# Patient Record
Sex: Male | Born: 2000 | Race: White | Hispanic: No | Marital: Single | Smoking: Never smoker
Health system: Southern US, Community
[De-identification: ages and names within clinical notes are randomized; demographics above are authoritative.]

## PROBLEM LIST (undated history)

## (undated) DIAGNOSIS — J45909 Unspecified asthma, uncomplicated: Secondary | ICD-10-CM

## (undated) HISTORY — PX: MYRINGOTOMY WITH TUBE PLACEMENT: SHX5663

## (undated) HISTORY — PX: TONSILLECTOMY: SUR1361

## (undated) HISTORY — PX: ADENOIDECTOMY: SUR15

---
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
## 2021-05-15 DIAGNOSIS — J4530 Mild persistent asthma, uncomplicated: Secondary | ICD-10-CM | POA: Diagnosis not present

## 2021-10-20 IMAGING — XA DG TIBIA/FIBULA 2V*R*
3 series · 4 of 4 positions shown · non-contrast
Comparison: None

CLINICAL DATA: 18-year-old male status post distal fibular ORIF.

EXAM:
RIGHT TIBIA AND FIBULA - 2 VIEW; DG C-ARM 1-60 MIN

[Series 1: ortho standard · 1 of 1 slices shown (1 of 3)]
[im 1/1]
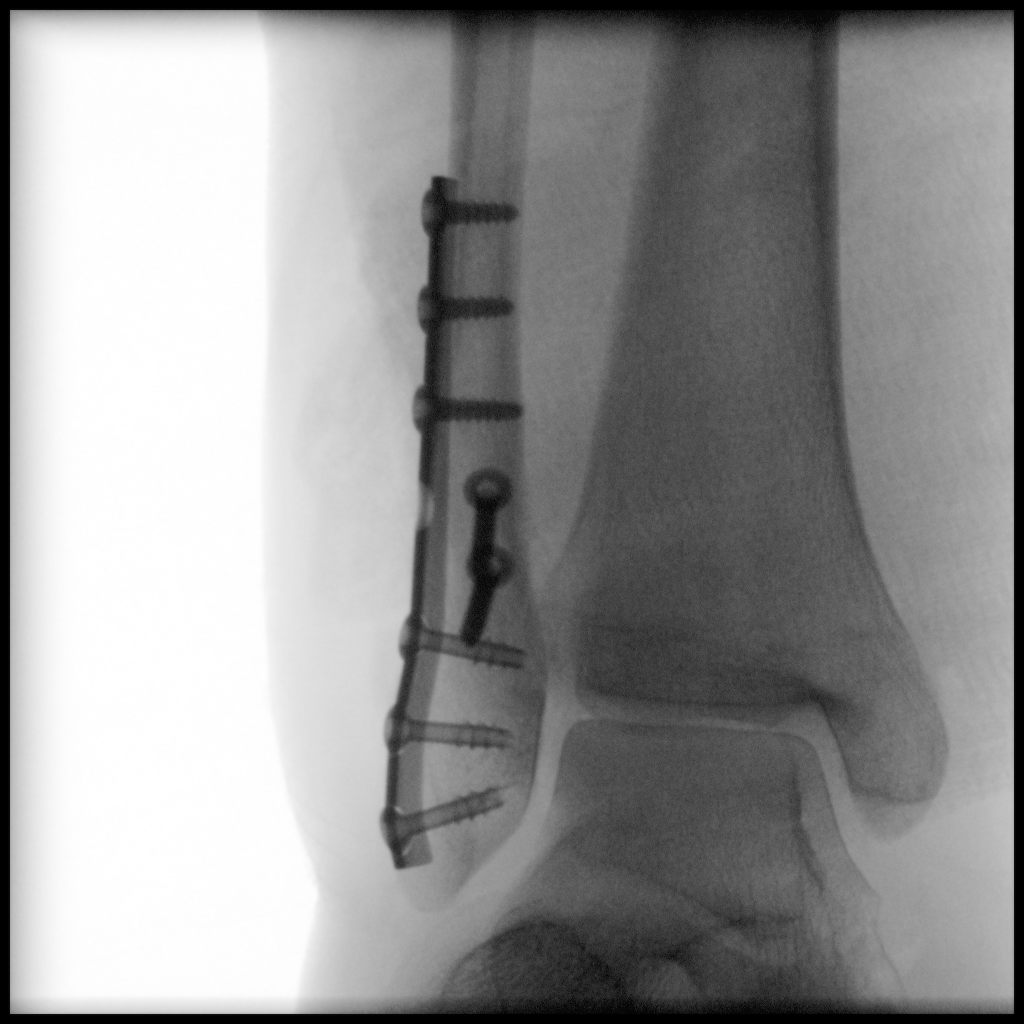

[Series 2: ortho standard · 1 of 1 slices shown (2 of 3)]
[im 1/1]
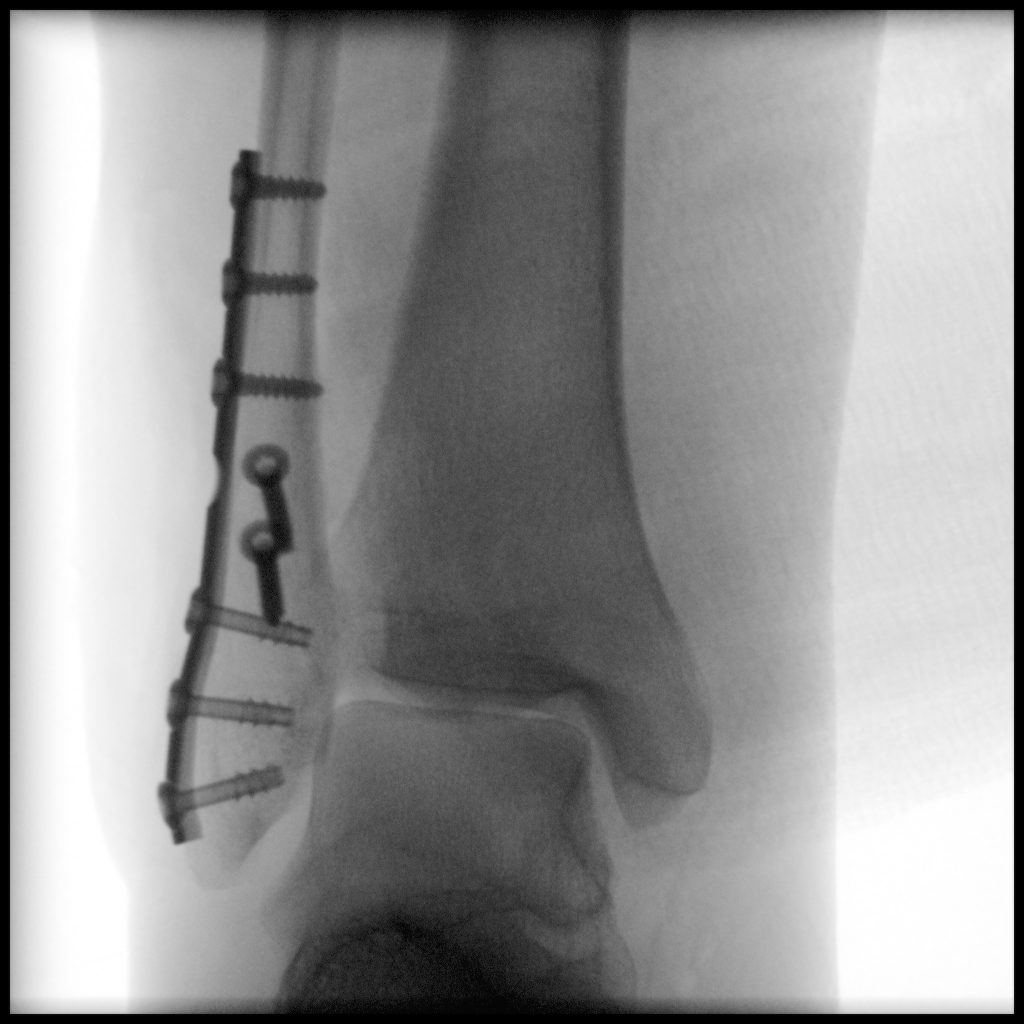

[Series 4: ortho standard · 2 of 2 slices shown (3 of 3)]
[im 1/2]
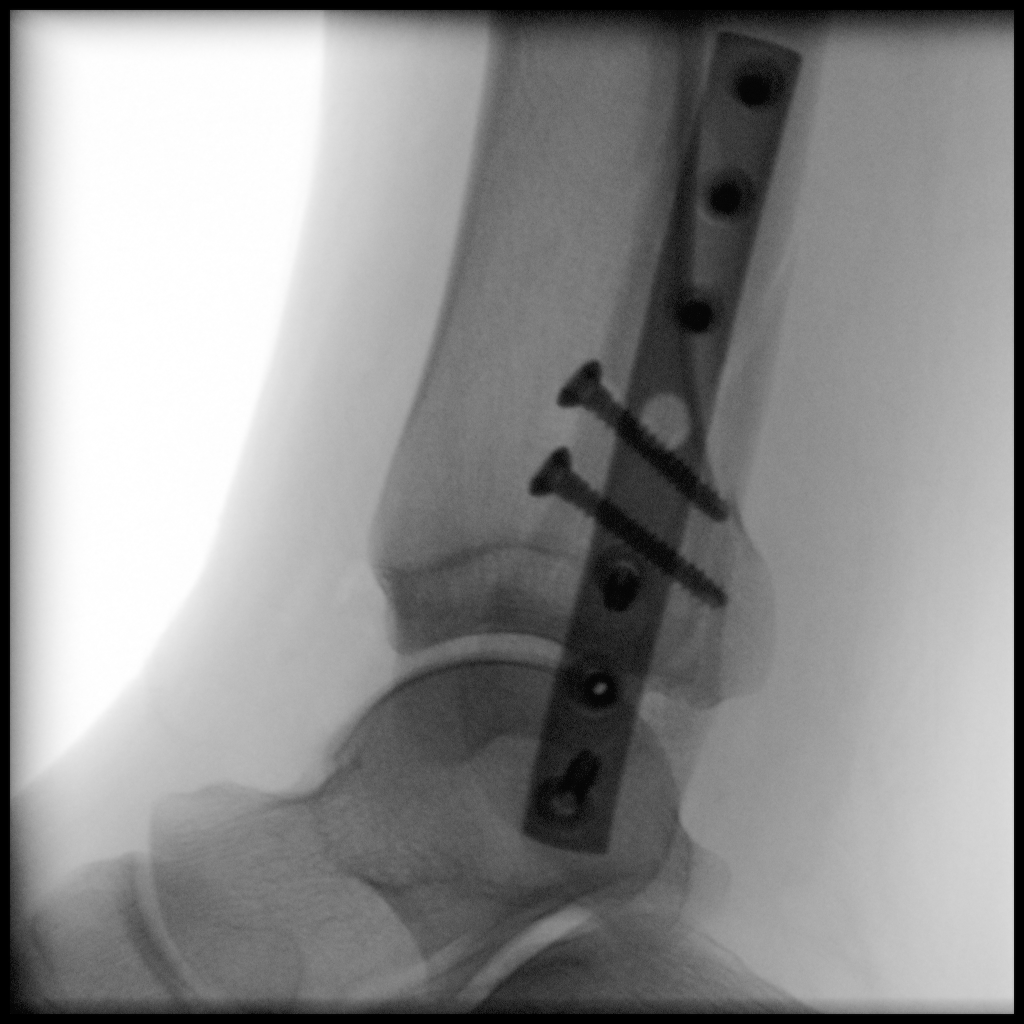
[im 2/2]
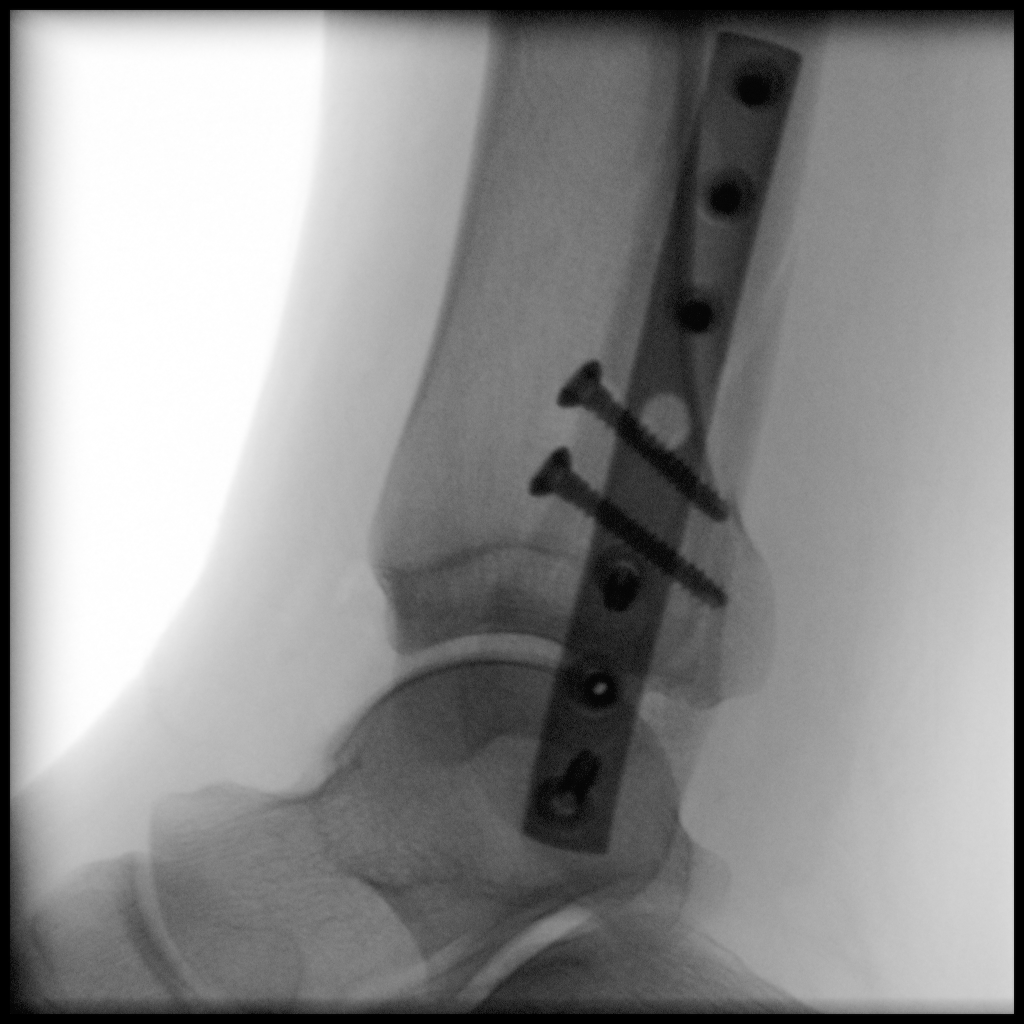

[4 of 4 positions shown; findings below may reference images not displayed]

FINDINGS: Three intraoperative fluoroscopic spot images provided. The total
fluoroscopic time is 12 seconds. ORIF of the distal fibula with
sideplate fixation and screws.
IMPRESSION: Intraoperative fluoroscopy.

## 2022-08-19 ENCOUNTER — Ambulatory Visit: Payer: Self-pay

## 2022-08-19 DIAGNOSIS — Z0000 Encounter for general adult medical examination without abnormal findings: Secondary | ICD-10-CM

## 2022-08-19 LAB — POCT URINALYSIS DIPSTICK
Bilirubin, UA: NEGATIVE
Blood, UA: NEGATIVE
Glucose, UA: NEGATIVE
Ketones, UA: NEGATIVE
Leukocytes, UA: NEGATIVE
Nitrite, UA: NEGATIVE
Protein, UA: NEGATIVE
Spec Grav, UA: >=1.03 — AB (ref 1.010–1.025)
Urobilinogen, UA: 0.2 E.U./dL
pH, UA: 6 (ref 5.0–8.0)

## 2022-08-19 NOTE — Progress Notes (Signed)
Pt presents today for physical labs, will return to clinic for scheduled physical./CL,RMA 

## 2022-08-20 LAB — CMP12+LP+TP+TSH+6AC+CBC/D/PLT
ALT: 30 IU/L (ref 0–44)
AST: 20 IU/L (ref 0–40)
Albumin/Globulin Ratio: 2.4 — ABNORMAL HIGH (ref 1.2–2.2)
Albumin: 5 g/dL (ref 4.3–5.2)
Alkaline Phosphatase: 74 IU/L (ref 51–125)
BUN/Creatinine Ratio: 11 (ref 9–20)
BUN: 11 mg/dL (ref 6–20)
Basophils Absolute: 0.1 10*3/uL (ref 0.0–0.2)
Basos: 1 %
Bilirubin Total: 0.4 mg/dL (ref 0.0–1.2)
Calcium: 9.7 mg/dL (ref 8.7–10.2)
Chloride: 100 mmol/L (ref 96–106)
Chol/HDL Ratio: 3.1 ratio (ref 0.0–5.0)
Cholesterol, Total: 110 mg/dL (ref 100–169)
Creatinine, Ser: 1.04 mg/dL (ref 0.76–1.27)
EOS (ABSOLUTE): 0.4 10*3/uL (ref 0.0–0.4)
Eos: 6 %
Estimated CHD Risk: <0.5 times avg.
Free Thyroxine Index: 2.3 (ref 1.2–4.9)
GGT: 9 IU/L (ref 0–65)
Globulin, Total: 2.1 g/dL (ref 1.5–4.5)
Glucose: 92 mg/dL (ref 70–99)
HDL: 36 mg/dL — ABNORMAL LOW (ref >39)
Hematocrit: 46.1 % (ref 37.5–51.0)
Hemoglobin: 15.9 g/dL (ref 13.0–17.7)
Immature Grans (Abs): 0.1 10*3/uL (ref 0.0–0.1)
Immature Granulocytes: 1 %
Iron: 101 ug/dL (ref 38–169)
LDH: 184 IU/L (ref 121–224)
LDL Chol Calc (NIH): 63 mg/dL (ref 0–109)
Lymphocytes Absolute: 2.6 10*3/uL (ref 0.7–3.1)
Lymphs: 37 %
MCH: 30.3 pg (ref 26.6–33.0)
MCHC: 34.5 g/dL (ref 31.5–35.7)
MCV: 88 fL (ref 79–97)
Monocytes Absolute: 0.5 10*3/uL (ref 0.1–0.9)
Monocytes: 7 %
Neutrophils Absolute: 3.4 10*3/uL (ref 1.4–7.0)
Neutrophils: 48 %
Phosphorus: 2.8 mg/dL — ABNORMAL LOW (ref 3.4–5.5)
Platelets: 252 10*3/uL (ref 150–450)
Potassium: 3.9 mmol/L (ref 3.5–5.2)
RBC: 5.25 x10E6/uL (ref 4.14–5.80)
RDW: 11.1 % — ABNORMAL LOW (ref 11.6–15.4)
Sodium: 138 mmol/L (ref 134–144)
T3 Uptake Ratio: 30 % (ref 24–39)
T4, Total: 7.5 ug/dL (ref 4.5–12.0)
TSH: 1.65 u[IU]/mL (ref 0.450–4.500)
Total Protein: 7.1 g/dL (ref 6.0–8.5)
Triglycerides: 47 mg/dL (ref 0–89)
Uric Acid: 5.4 mg/dL (ref 3.8–8.4)
VLDL Cholesterol Cal: 11 mg/dL (ref 5–40)
WBC: 7 10*3/uL (ref 3.4–10.8)
eGFR: 106 mL/min/{1.73_m2} (ref >59)

## 2022-08-26 ENCOUNTER — Encounter: Payer: Self-pay | Admitting: Physician Assistant

## 2022-08-26 ENCOUNTER — Ambulatory Visit: Payer: Self-pay | Admitting: Physician Assistant

## 2022-08-26 VITALS — BP 130/72 | HR 93 | Temp 98.6°F | Resp 12 | Ht 69.0 in | Wt 203.0 lb

## 2022-08-26 DIAGNOSIS — Z0000 Encounter for general adult medical examination without abnormal findings: Secondary | ICD-10-CM

## 2022-08-26 NOTE — Progress Notes (Signed)
[facility] occupational health clinic  ____________________________________________   None    (approximate)  I have reviewed the triage vital signs and the nursing notes.   HISTORY  Chief Complaint No chief complaint on file.   HPI Sean Crane is a 21 y.o. male patient presents for annual physical exam.  Patient voiced no concerns or complaints.         Past Medical History:  Diagnosis Date   Asthma     Patient Active Problem List   Diagnosis Date Noted   Closed fracture of distal end of right fibula 07/07/2020   Ankle fracture, right 07/06/2020    Past Surgical History:  Procedure Laterality Date   ADENOIDECTOMY     CAST APPLICATION Left 4/50/3888   Procedure: CAST APPLICATION;  Surgeon: Thornton Park, MD;  Location: [facility] ORS;  Service: Orthopedics;  Laterality: Left;   CLOSED REDUCTION WRIST FRACTURE Left 03/17/2016   Procedure: CLOSED REDUCTION WRIST;  Surgeon: Thornton Park, MD;  Location: [facility] ORS;  Service: Orthopedics;  Laterality: Left;   MYRINGOTOMY WITH TUBE PLACEMENT Bilateral    ORIF FIBULA FRACTURE Right 07/07/2020   Procedure: OPEN REDUCTION INTERNAL FIXATION (ORIF) FIBULA FRACTURE;  Surgeon: Corky Mull, MD;  Location: [facility] ORS;  Service: Orthopedics;  Laterality: Right;   TONSILLECTOMY      Prior to Admission medications   Medication Sig Start Date End Date Taking? Authorizing Provider  ibuprofen (ADVIL,MOTRIN) 200 MG tablet Take 200 mg by mouth every 6 (six) hours as needed.   Yes [provider]  loratadine (CLARITIN) 10 MG tablet Take 10 mg by mouth daily as needed for allergies.   Yes [provider]  albuterol (PROVENTIL) (2.5 MG/3ML) 0.083% nebulizer solution SMARTSIG:1 Vial(s) Via Nebulizer Every 4-6 Hours PRN 05/12/22   [provider]    Allergies Patient has no known allergies.  Family History  Problem Relation Age of Onset   Diabetes Mother     Social History Social History    Tobacco Use   Smoking status: Never  Substance Use Topics   Alcohol use: No   Drug use: No    Review of Systems Constitutional: No fever/chills Eyes: No visual changes. ENT: No sore throat. Cardiovascular: Denies chest pain. Respiratory: Denies shortness of breath. Gastrointestinal: No abdominal pain.  No nausea, no vomiting.  No diarrhea.  No constipation. Genitourinary: Negative for dysuria. Musculoskeletal: Negative for back pain. Skin: Negative for rash. Neurological: Negative for headaches, focal weakness or numbness.  ____________________________________________   PHYSICAL EXAM:  VITAL SIGNS: BP is 130/72, pulse 93, respiration 12, temperature 98.6, patient 90% O2 sat on room air.  Patient weighs 203 pounds and BMI is 29.98. Constitutional: Alert and oriented. Well appearing and in no acute distress. Eyes: Conjunctivae are normal. PERRL. EOMI. Head: Atraumatic. Nose: No congestion/rhinnorhea. Mouth/Throat: Mucous membranes are moist.  Oropharynx non-erythematous. Neck: No stridor.  No cervical spine tenderness to palpation. Hematological/Lymphatic/Immunilogical: No cervical lymphadenopathy. Cardiovascular: Normal rate, regular rhythm. Grossly normal heart sounds.  Good peripheral circulation. Respiratory: Normal respiratory effort.  No retractions. Lungs CTAB. Gastrointestinal: Soft and nontender. No distention. No abdominal bruits. No CVA tenderness. Genitourinary: Deferred Musculoskeletal: No lower extremity tenderness nor edema.  No joint effusions. Neurologic:  Normal speech and language. No gross focal neurologic deficits are appreciated. No gait instability. Skin:  Skin is warm, dry and intact. No rash noted. Psychiatric: Mood and affect are normal. Speech and behavior are normal.  ____________________________________________   LABS _  Component Ref Range & Units 7 d ago  Glucose 70 - 99 mg/dL 92   Uric Acid 3.8 - 8.4 mg/dL 5.4   Comment:             Therapeutic target for gout patients: <6.0  BUN 6 - 20 mg/dL 11   Creatinine, Ser 0.76 - 1.27 mg/dL 1.04   eGFR >59 mL/min/1.73 106   BUN/Creatinine Ratio 9 - 20 11   Sodium 134 - 144 mmol/L 138   Potassium 3.5 - 5.2 mmol/L 3.9   Chloride 96 - 106 mmol/L 100   Calcium 8.7 - 10.2 mg/dL 9.7   Phosphorus 3.4 - 5.5 mg/dL 2.8 Low    Total Protein 6.0 - 8.5 g/dL 7.1   Albumin 4.3 - 5.2 g/dL 5.0   Globulin, Total 1.5 - 4.5 g/dL 2.1   Albumin/Globulin Ratio 1.2 - 2.2 2.4 High    Bilirubin Total 0.0 - 1.2 mg/dL 0.4   Alkaline Phosphatase 51 - 125 IU/L 74   LDH 121 - 224 IU/L 184   AST 0 - 40 IU/L 20   ALT 0 - 44 IU/L 30   GGT 0 - 65 IU/L 9   Iron 38 - 169 ug/dL 101   Cholesterol, Total 100 - 169 mg/dL 110   Triglycerides 0 - 89 mg/dL 47   HDL >39 mg/dL 36 Low    VLDL Cholesterol Cal 5 - 40 mg/dL 11   LDL Chol Calc (NIH) 0 - 109 mg/dL 63   Chol/HDL Ratio 0.0 - 5.0 ratio 3.1   Comment:                                   T. Chol/HDL Ratio                                              Men  Women                                1/2 Avg.Risk  3.4    3.3                                    Avg.Risk  5.0    4.4                                 2X Avg.Risk  9.6    7.1                                 3X Avg.Risk 23.4   11.0   Estimated CHD Risk times avg.  < 0.5   Comment: The Estimated CHD Risk is based on adult population and may not apply  to children and adolescents <20 yrs of age.  The CHD Risk is based on the T. Chol/HDL ratio. Other  factors affect CHD Risk such as hypertension, smoking,  diabetes, severe obesity, and family history of  premature CHD.   TSH 0.450 - 4.500 uIU/mL 1.650   T4, Total 4.5 - 12.0 ug/dL 7.5   T3  Uptake Ratio 24 - 39 % 30   Free Thyroxine Index 1.2 - 4.9 2.3   WBC 3.4 - 10.8 x10E3/uL 7.0   RBC 4.14 - 5.80 x10E6/uL 5.25   Hemoglobin 13.0 - 17.7 g/dL 15.9   Hematocrit 37.5 - 51.0 % 46.1   MCV 79 - 97 fL 88   MCH 26.6 - 33.0 pg 30.3   MCHC 31.5 - 35.7 g/dL 34.5    RDW 11.6 - 15.4 % 11.1 Low    Platelets 150 - 450 x10E3/uL 252   Neutrophils Not Estab. % 48   Lymphs Not Estab. % 37   Monocytes Not Estab. % 7   Eos Not Estab. % 6   Basos Not Estab. % 1   Neutrophils Absolute 1.4 - 7.0 x10E3/uL 3.4   Lymphocytes Absolute 0.7 - 3.1 x10E3/uL 2.6   Monocytes Absolute 0.1 - 0.9 x10E3/uL 0.5   EOS (ABSOLUTE) 0.0 - 0.4 x10E3/uL 0.4   Basophils Absolute 0.0 - 0.2 x10E3/uL 0.1   Immature Granulocytes Not Estab. % 1   Immature Grans (Abs) 0.0 - 0.1 x10E3/uL 0.1   Resulting Agency  [facility]         Narrative Performed byMaryan Puls Performed at:  [facility]  [address], [place], [place]  [id number]  Lab Director: Rush Farmer MD, Phone:  [phone]             Other Results from 08/19/2022   Contains abnormal data POCT Urinalysis Dipstick (CPT 81002) Order: [id number] Status: Final result    Visible to patient: Yes (seen)    Next appt: None    Dx: Annual physical exam    0 Result Notes     Component Ref Range & Units 7 d ago  Color, UA  Amber   Clarity, UA  Clear   Glucose, UA Negative Negative   Bilirubin, UA  Negative   Ketones, UA  Negative   Spec Grav, UA 1.010 - 1.025 >=1.030 Abnormal    Blood, UA  Negative   pH, UA 5.0 - 8.0 6.0   Protein, UA Negative Negative   Urobilinogen, UA 0.2 or 1.0 E.U./dL 0.2   Nitrite, UA  Negative   Leukocytes, UA Negative Negative   Appearance          ___________________________________________  ____________________________________________     ____________________________________________   INITIAL IMPRESSION / ASSESSMENT AND PLAN  As part of my medical decision making, I reviewed the following data within the electronic MEDICAL RECORD NUMBER      Discussed no acute findings on lab results with patient.        ____________________________________________   FINAL CLINICAL IMPRESSION  Well exam   ED Discharge Orders     None        Note:  This document was  prepared using Dragon voice recognition software and may include unintentional dictation errors.

## 2022-08-26 NOTE — Progress Notes (Signed)
Pt presents today to complete physical. 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 Pt denies any issues or concerns./CL,RMA

## 2022-11-03 ENCOUNTER — Ambulatory Visit: Payer: Self-pay | Admitting: Physician Assistant

## 2022-11-03 ENCOUNTER — Encounter: Payer: Self-pay | Admitting: Physician Assistant

## 2022-11-03 VITALS — BP 125/83 | HR 91 | Temp 98.2°F | Resp 12 | Ht 69.0 in | Wt 200.0 lb

## 2022-11-03 DIAGNOSIS — Z024 Encounter for examination for driving license: Secondary | ICD-10-CM

## 2022-11-03 LAB — POCT URINALYSIS DIPSTICK
Bilirubin, UA: NEGATIVE
Blood, UA: NEGATIVE
Glucose, UA: NEGATIVE
Ketones, UA: NEGATIVE
Leukocytes, UA: NEGATIVE
Nitrite, UA: NEGATIVE
Protein, UA: NEGATIVE
Spec Grav, UA: 1.025 (ref 1.010–1.025)
Urobilinogen, UA: 0.2 E.U./dL
pH, UA: 6 (ref 5.0–8.0)

## 2022-11-03 NOTE — Progress Notes (Signed)
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 Pt denies amy issues or concerns at this time.  Snellen vision: B: 20/20               R: 20/15     L: 20/20

## 2022-11-03 NOTE — Progress Notes (Signed)
[facility]    None    (approximate)  I have reviewed the triage vital signs and the nursing notes.   HISTORY  Chief Complaint Commercial Driver's License Exam   HPI Sean Crane is a 21 y.o. male patient presents for DOT recertification.  Patient voiced no concerns or complaints.        Past Medical History:  Diagnosis Date   Asthma     Patient Active Problem List   Diagnosis Date Noted   Closed fracture of distal end of right fibula 07/07/2020   Ankle fracture, right 07/06/2020    Past Surgical History:  Procedure Laterality Date   ADENOIDECTOMY     CAST APPLICATION Left 03/17/2016   Procedure: CAST APPLICATION;  Surgeon: Juanell Fairly, MD;  Location: [facility];  Service: Orthopedics;  Laterality: Left;   CLOSED REDUCTION WRIST FRACTURE Left 03/17/2016   Procedure: CLOSED REDUCTION WRIST;  Surgeon: Juanell Fairly, MD;  Location: [facility];  Service: Orthopedics;  Laterality: Left;   MYRINGOTOMY WITH TUBE PLACEMENT Bilateral    ORIF FIBULA FRACTURE Right 07/07/2020   Procedure: OPEN REDUCTION INTERNAL FIXATION (ORIF) FIBULA FRACTURE;  Surgeon: Christena Flake, MD;  Location: [facility];  Service: Orthopedics;  Laterality: Right;   TONSILLECTOMY      Prior to Admission medications   Medication Sig Start Date End Date Taking? Authorizing Provider  albuterol (PROVENTIL) (2.5 MG/3ML) 0.083% nebulizer solution SMARTSIG:1 Vial(s) Via Nebulizer Every 4-6 Hours PRN 05/12/22  Yes [provider]  ibuprofen (ADVIL,MOTRIN) 200 MG tablet Take 200 mg by mouth every 6 (six) hours as needed.   Yes [provider]  loratadine (CLARITIN) 10 MG tablet Take 10 mg by mouth daily as needed for allergies.   Yes [provider]    Allergies Patient has no known allergies.  Family History  Problem Relation Age of Onset   Diabetes Mother     Social History Social History   Tobacco Use   Smoking status: Never   Substance Use Topics   Alcohol use: No   Drug use: No    Review of Systems Constitutional: No fever/chills Eyes: No visual changes. ENT: No sore throat. Cardiovascular: Denies chest pain. Respiratory: Denies shortness of breath. Gastrointestinal: No abdominal pain.  No nausea, no vomiting.  No diarrhea.  No constipation. Genitourinary: Negative for dysuria. Musculoskeletal: Negative for back pain. Skin: Negative for rash. Neurological: Negative for headaches, focal weakness or numbness.   ____________________________________________   PHYSICAL EXAM:  VITAL SIGNS: BP is 125/83, pulse 91, respiration 12, temperature 98.2, and patient weighs 200 pounds.  BMI is 29.53. Constitutional: Alert and oriented. Well appearing and in no acute distress. Eyes: Conjunctivae are normal. PERRL. EOMI. Head: Atraumatic. Nose: No congestion/rhinnorhea. Mouth/Throat: Mucous membranes are moist.  Oropharynx non-erythematous. Neck: No stridor.  No cervical spine tenderness to palpation. Hematological/Lymphatic/Immunilogical: No cervical lymphadenopathy. Cardiovascular: Normal rate, regular rhythm. Grossly normal heart sounds.  Good peripheral circulation. Respiratory: Normal respiratory effort.  No retractions. Lungs CTAB. Gastrointestinal: Soft and nontender. No distention. No abdominal bruits. No CVA tenderness. Genitourinary: Deferred Musculoskeletal: No lower extremity tenderness nor edema.  No joint effusions. Neurologic:  Normal speech and language. No gross focal neurologic deficits are appreciated. No gait instability. Skin:  Skin is warm, dry and intact. No rash noted. Psychiatric: Mood and affect are normal. Speech and behavior are normal.  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     ____________________________________________   FINAL CLINICAL IMPRESSION Well exam   ED Discharge Orders          Ordered    POCT urinalysis dipstick        11/03/22 1520             Note:  This document was prepared using Dragon voice recognition software and may include unintentional dictation errors.

## 2023-01-01 DIAGNOSIS — S9032XA Contusion of left foot, initial encounter: Secondary | ICD-10-CM | POA: Diagnosis not present

## 2023-06-14 DIAGNOSIS — M79604 Pain in right leg: Secondary | ICD-10-CM | POA: Diagnosis not present

## 2023-06-23 ENCOUNTER — Telehealth: Payer: Self-pay

## 2023-06-23 ENCOUNTER — Telehealth: Payer: Self-pay | Admitting: *Deleted
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
## 2023-06-23 NOTE — Telephone Encounter (Signed)
Noted. I sent a powershare request to [facility].
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
## 2023-06-24 NOTE — Telephone Encounter (Signed)
Patient called and cancelled appt. [facility] will not pay for this visit. He is scheduled to see their preferred neurosurgeon.

## 2023-06-27 ENCOUNTER — Ambulatory Visit: Payer: 59 | Admitting: Physician Assistant

## 2023-06-28 ENCOUNTER — Inpatient Hospital Stay
Admission: RE | Admit: 2023-06-28 | Discharge: 2023-06-28 | Disposition: A | Discharge status: Home / Self Care | Payer: Self-pay | Source: Ambulatory Visit | Attending: Neurosurgery | Admitting: Neurosurgery

## 2023-06-28 ENCOUNTER — Other Ambulatory Visit: Payer: Self-pay

## 2023-06-28 DIAGNOSIS — Z049 Encounter for examination and observation for unspecified reason: Secondary | ICD-10-CM

## 2023-07-07 ENCOUNTER — Ambulatory Visit: Payer: 59 | Admitting: Neurosurgery

## 2024-10-29 ENCOUNTER — Ambulatory Visit: Payer: Self-pay | Admitting: Physician Assistant

## 2024-10-29 ENCOUNTER — Encounter: Payer: Self-pay | Admitting: Physician Assistant

## 2024-10-29 VITALS — BP 112/67 | HR 86 | Temp 97.5°F | Resp 14 | Wt 189.0 lb

## 2024-10-29 DIAGNOSIS — Z024 Encounter for examination for driving license: Secondary | ICD-10-CM

## 2024-10-29 LAB — POCT URINALYSIS DIPSTICK
Bilirubin, UA: NEGATIVE
Blood, UA: NEGATIVE
Glucose, UA: NEGATIVE
Ketones, UA: NEGATIVE
Leukocytes, UA: NEGATIVE
Nitrite, UA: NEGATIVE
Protein, UA: NEGATIVE
Spec Grav, UA: 1.015 (ref 1.010–1.025)
Urobilinogen, UA: 0.2 U/dL
pH, UA: 6.5 (ref 5.0–8.0)

## 2024-10-29 NOTE — Progress Notes (Signed)
   Subjective: DOT recertification    Patient ID: Sean Crane, male    DOB: 08/30/2001, 23 y.o.   MRN: [id number]  HPI Patient presents for DOT recertification.  Patient voiced no concerns or complaints.   Review of Systems Asthma    Objective:   Physical Exam BP 112/67  Pulse Rate 86  Temp 97.5 F (36.4 C)  Weight 189 lb (85.7 kg)  Resp 14  SpO2 96 %  HEENT is unremarkable. Neck is supple pharmacotherapy or bruits. Lungs are clear to auscultation. Heart regular rate and rhythm. Abdomen with negative HSM, normoactive bowel sounds, soft soft and nontender to palpation. No obvious deformity to the cervical or lumbar spine.  Patient has full and equal range of motion of cervical and lumbar spine. No obvious deformity to the upper or lower extremities.  Patient has full and equal range of motion of the upper and lower extremities. Cranial nerves II through XII are grossly intact.       Assessment & Plan: DOT recertification   Patient meets criteria for 2-year certification.

## 2024-10-29 NOTE — Progress Notes (Signed)
 Here to complete DOT physical.  NO complaints voiced.  UA obtained.  Snellen chart OS 20/20, OD 20/20 and OU 20/15.
